# Patient Record
Sex: Male | Born: 1955 | Race: White | Hispanic: No | Marital: Single | Smoking: Current every day smoker
Health system: Southern US, Community
[De-identification: ages and names within clinical notes are randomized; demographics above are authoritative.]

## PROBLEM LIST (undated history)

## (undated) DIAGNOSIS — I10 Essential (primary) hypertension: Secondary | ICD-10-CM

## (undated) DIAGNOSIS — J439 Emphysema, unspecified: Secondary | ICD-10-CM

## (undated) DIAGNOSIS — I350 Nonrheumatic aortic (valve) stenosis: Secondary | ICD-10-CM

## (undated) DIAGNOSIS — F99 Mental disorder, not otherwise specified: Secondary | ICD-10-CM

## (undated) DIAGNOSIS — K4090 Unilateral inguinal hernia, without obstruction or gangrene, not specified as recurrent: Secondary | ICD-10-CM

## (undated) HISTORY — DX: Nonrheumatic aortic (valve) stenosis: I35.0

## (undated) HISTORY — DX: Mental disorder, not otherwise specified: F99

## (undated) HISTORY — DX: Unilateral inguinal hernia, without obstruction or gangrene, not specified as recurrent: K40.90

## (undated) HISTORY — PX: AORTIC VALVE REPLACEMENT: SHX41

---

## 2010-10-08 ENCOUNTER — Encounter: Payer: Self-pay | Admitting: *Deleted

## 2010-10-08 DIAGNOSIS — I359 Nonrheumatic aortic valve disorder, unspecified: Secondary | ICD-10-CM

## 2010-10-14 ENCOUNTER — Encounter: Payer: Medicare Other | Admitting: Cardiothoracic Surgery

## 2010-11-16 DIAGNOSIS — I359 Nonrheumatic aortic valve disorder, unspecified: Secondary | ICD-10-CM

## 2010-12-24 ENCOUNTER — Encounter: Payer: Self-pay | Admitting: Thoracic Surgery (Cardiothoracic Vascular Surgery)

## 2010-12-29 ENCOUNTER — Ambulatory Visit: Payer: Medicare Other

## 2011-01-05 ENCOUNTER — Ambulatory Visit (INDEPENDENT_AMBULATORY_CARE_PROVIDER_SITE_OTHER): Payer: Medicare Other | Admitting: Physician Assistant

## 2011-01-05 DIAGNOSIS — F209 Schizophrenia, unspecified: Secondary | ICD-10-CM

## 2011-01-05 DIAGNOSIS — J449 Chronic obstructive pulmonary disease, unspecified: Secondary | ICD-10-CM

## 2011-01-05 DIAGNOSIS — Z952 Presence of prosthetic heart valve: Secondary | ICD-10-CM

## 2011-01-05 DIAGNOSIS — Z720 Tobacco use: Secondary | ICD-10-CM

## 2011-01-05 DIAGNOSIS — I359 Nonrheumatic aortic valve disorder, unspecified: Secondary | ICD-10-CM

## 2011-01-05 NOTE — Progress Notes (Unsigned)
Billy Cole return to the office today for scheduled followup after aortic valve replacement on 12/03/2010 by Dr. Arvilla Market (25mm C-E MagnaEase tissue valve) for severe aortic stenosis and bicuspid aortic valve. His postoperative course was uncomplicated. Since his discharge home he is continuing to make satisfactory progress. He denies any shortness of breath. He continues to have some parasternal soreness.  Vital signs: temperature is afebrile, blood pressure 110/78, heart rate 100, respirations 18  Heart is in a regular rhythm. He has no murmur. Breath sounds are clear to auscultation and are full and equal. Sternal incision is intact and healing with no sign of complication. He has no peripheral edema. Medications are reviewed and include aspirin 81 mg by mouth daily Lopressor 25 mg by mouth twice a day perphenazine 8 mg by mouth bedtime 2 mg every morning. He is taking Tylenol for pain but requested a refill of his Vicodin. A new prescription is given for Vicodin 5/500 one by mouth every 6 hours when necessary pain. Dispense #60.  Impression: Satisfactory progress following aortic valve replacement for severe aortic stenosis and bicuspid aortic valve. No further followup is scheduled in our office. Billy Cole will followup with his cardiologist as instructed.

## 2011-02-27 DIAGNOSIS — Z952 Presence of prosthetic heart valve: Secondary | ICD-10-CM

## 2012-07-26 DIAGNOSIS — R079 Chest pain, unspecified: Secondary | ICD-10-CM

## 2013-03-08 DIAGNOSIS — F29 Unspecified psychosis not due to a substance or known physiological condition: Secondary | ICD-10-CM | POA: Diagnosis not present

## 2013-09-19 DIAGNOSIS — F29 Unspecified psychosis not due to a substance or known physiological condition: Secondary | ICD-10-CM | POA: Diagnosis not present

## 2014-03-06 DIAGNOSIS — F29 Unspecified psychosis not due to a substance or known physiological condition: Secondary | ICD-10-CM | POA: Diagnosis not present

## 2014-08-14 DIAGNOSIS — F29 Unspecified psychosis not due to a substance or known physiological condition: Secondary | ICD-10-CM | POA: Diagnosis not present

## 2014-09-30 DIAGNOSIS — Z6823 Body mass index (BMI) 23.0-23.9, adult: Secondary | ICD-10-CM | POA: Diagnosis not present

## 2014-09-30 DIAGNOSIS — J40 Bronchitis, not specified as acute or chronic: Secondary | ICD-10-CM | POA: Diagnosis not present

## 2014-09-30 DIAGNOSIS — Z1389 Encounter for screening for other disorder: Secondary | ICD-10-CM | POA: Diagnosis not present

## 2014-09-30 DIAGNOSIS — Z87891 Personal history of nicotine dependence: Secondary | ICD-10-CM | POA: Diagnosis not present

## 2014-09-30 DIAGNOSIS — Z0000 Encounter for general adult medical examination without abnormal findings: Secondary | ICD-10-CM | POA: Diagnosis not present

## 2014-09-30 DIAGNOSIS — F17200 Nicotine dependence, unspecified, uncomplicated: Secondary | ICD-10-CM | POA: Diagnosis not present

## 2014-10-02 DIAGNOSIS — F17200 Nicotine dependence, unspecified, uncomplicated: Secondary | ICD-10-CM | POA: Diagnosis not present

## 2014-10-02 DIAGNOSIS — Z6823 Body mass index (BMI) 23.0-23.9, adult: Secondary | ICD-10-CM | POA: Diagnosis not present

## 2014-10-02 DIAGNOSIS — Z952 Presence of prosthetic heart valve: Secondary | ICD-10-CM | POA: Diagnosis not present

## 2014-10-02 DIAGNOSIS — J209 Acute bronchitis, unspecified: Secondary | ICD-10-CM | POA: Diagnosis not present

## 2014-10-02 DIAGNOSIS — J40 Bronchitis, not specified as acute or chronic: Secondary | ICD-10-CM | POA: Diagnosis not present

## 2014-10-02 DIAGNOSIS — K219 Gastro-esophageal reflux disease without esophagitis: Secondary | ICD-10-CM | POA: Diagnosis not present

## 2014-10-02 DIAGNOSIS — Z0000 Encounter for general adult medical examination without abnormal findings: Secondary | ICD-10-CM | POA: Diagnosis not present

## 2014-10-10 DIAGNOSIS — Z1389 Encounter for screening for other disorder: Secondary | ICD-10-CM | POA: Diagnosis not present

## 2014-10-10 DIAGNOSIS — Z87891 Personal history of nicotine dependence: Secondary | ICD-10-CM | POA: Diagnosis not present

## 2014-11-18 ENCOUNTER — Encounter: Payer: Medicare Other | Admitting: Vascular Surgery

## 2014-11-25 ENCOUNTER — Encounter: Payer: Self-pay | Admitting: Vascular Surgery

## 2014-12-02 ENCOUNTER — Ambulatory Visit (INDEPENDENT_AMBULATORY_CARE_PROVIDER_SITE_OTHER): Payer: Medicare Other | Admitting: Vascular Surgery

## 2014-12-02 ENCOUNTER — Encounter: Payer: Self-pay | Admitting: Vascular Surgery

## 2014-12-02 VITALS — BP 114/75 | HR 89 | Ht 71.0 in | Wt 171.2 lb

## 2014-12-02 DIAGNOSIS — I728 Aneurysm of other specified arteries: Secondary | ICD-10-CM

## 2014-12-02 NOTE — Addendum Note (Signed)
Addended by: Adria DillELDRIDGE-LEWIS, Tonimarie Gritz L on: 12/02/2014 05:16 PM   Modules accepted: Orders

## 2014-12-02 NOTE — Progress Notes (Signed)
[facility] NOTE   MRN : [id number]  Reason for Consult: CT scan shows splenic artery aneursym Referring Physician: [facility] Dr.   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  He has no symptoms of abdominal pain or unexplained weight loss.  Past medical history includes: tobacco abuse and bronchitis.  He denise CAD, hypertension or hyperlipidemia.  He has had an aortic valve replacement 3-4 years ago in [facility].     Current Outpatient Prescriptions  Medication Sig Dispense Refill  . albuterol (PROVENTIL HFA;VENTOLIN HFA) 108 (90 BASE) MCG/ACT inhaler Inhale 2 puffs into the lungs every 6 (six) hours as needed for wheezing or shortness of breath.    . Hydrocodone-Acetaminophen (VICODIN PO) 1 or 2 every 4 hours as needed for pain     . Metoprolol Tartrate (LOPRESSOR PO) Take 25 mg by mouth 2 (two) times daily.      Marland Kitchen perphenazine (TRILAFON) 8 MG tablet Resume perphenazine  p.o. at bedtime and  q a.m.      No current facility-administered medications for this visit.    Pt meds include: Statin :No Betablocker: No ASA: No Other anticoagulants/antiplatelets: none  Past Medical History  Diagnosis Date  . Aortic stenosis   . Left inguinal hernia   . Psychiatric disorder     Past Surgical History  Procedure Laterality Date  . Aortic valve replacement      with perrcardial tissue valve [employer] model 3300TFX 25mm, serial number [id number] on  11-16-2010    Social History Social History  Substance Use Topics  . Smoking status: Current Every Day Smoker -- 1.00 packs/day    Types: Cigarettes  . Smokeless tobacco: None  . Alcohol Use: No    Family History   No Known Allergies   REVIEW OF SYSTEMS  General:  Weight loss,  Fever,   chills Neurologic:  Dizziness,  Blackouts,  Seizure  Stroke,  "Mini stroke",  Slurred speech,  Temporary blindness;  weakness in arms or legs,  Hoarseness  Dysphagia Cardiac:  Chest pain/pressure,  Shortness of breath at rest  Shortness of breath with exertion,  Atrial fibrillation or irregular heartbeat  Vascular:  Pain in legs with walking,  Pain in legs at rest,  Pain in legs at night,   Non-healing ulcer,  Blood clot in vein/DVT,   Pulmonary:  Home oxygen, [ x] Productive cough,  Coughing up blood,  Asthma,   Wheezing  COPD Musculoskeletal:   Arthritis,  Low back pain,  Joint pain Hematologic:  Easy Bruising,  Anemia;  Hepatitis Gastrointestinal:  Blood in stool,  Gastroesophageal Reflux/heartburn, Urinary:  chronic Kidney disease,  on HD -  MWF or  TTHS,  Burning with urination,  Difficulty urinating Skin:  Rashes,  Wounds Psychological:  Anxiety,  Depression  Physical Examination Filed Vitals:   12/02/14 1401  BP: 114/75  Pulse: 89  Height:  (1.803 m)  Weight: 171 lb 3.2 oz (77.656 kg)  SpO2: 97%   Body mass index is 23.89 kg/(m^2).  General:  WDWN in NAD Gait: Normal HENT: WNL Eyes: Pupils equal Pulmonary: normal non-labored breathing , without Rales, rhonchi,  wheezing Cardiac: RRR, without  Murmurs, rubs or gallops; No carotid bruits Abdomen: soft, NT, no masses Skin: no rashes, ulcers noted;  no Gangrene , no cellulitis; no open wounds;   Vascular Exam/Pulses:Palpable radial, brachial, femoral, DP/PT pulses bilaterally   Musculoskeletal: no muscle wasting or atrophy; no edema  Neurologic: A&O X 3; Appropriate Affect ;  SENSATION: normal; MOTOR FUNCTION: 5/5 Symmetric Speech is fluent/normal   Significant Diagnostic Studies: CBC No results found for: WBC, HGB, HCT, MCV, PLT  BMET No results found for: NA, K, CL, CO2, GLUCOSE,  BUN, CREATININE, CALCIUM, GFRNONAA, GFRAA CrCl cannot be calculated (Patient has no serum creatinine result on file.).  COAG No results found for: INR, PROTIME   Non-Invasive Vascular Imaging:  10/07/2014 CT chest w/o contrast revealed 2 cm calcified splenic artery The CT was reviewed with Dr. Arbie CookeyEarly   ASSESSMENT/PLAN:  2 cm calcified splenic artery He is asymptomatic and this was an incidental finding.  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  We also discussed symptoms of sever pain in the abdomin would be a sign of rupture.  We recommend repeat CT angio of the abdomin in 1 year.  Clinton GallantCOLLINS, EMMA [facility] 12/02/2014 2:40 PM  The patient was seen in conjunction with Dr. Arbie CookeyEarly I have examined the patient, reviewed and agree with above. Explained this 2 cm splenic artery aneurysm as an incidental finding. It is away from the splenic hilum. Explain the low risk of rupture short-term basis. Have recommended repeat CT scan one year to rule out enlargement. Did explain symptoms of leaking splenic aneurysm to him and he knows to report immediately to the emergency room should this occur  Gretta BeganEarly, Robb Sibal, MD 12/02/2014 3:13 PM

## 2014-12-04 NOTE — Addendum Note (Signed)
Addended by: Adria DillELDRIDGE-LEWIS, Digby Groeneveld L on: 12/04/2014 04:38 PM   Modules accepted: Orders

## 2014-12-08 NOTE — Addendum Note (Signed)
Addended by: Adria DillELDRIDGE-LEWIS, Antwonette Feliz L on: 12/08/2014 04:18 PM   Modules accepted: Orders

## 2015-01-12 ENCOUNTER — Other Ambulatory Visit: Payer: Self-pay

## 2015-01-12 DIAGNOSIS — I728 Aneurysm of other specified arteries: Secondary | ICD-10-CM

## 2015-01-16 DIAGNOSIS — F29 Unspecified psychosis not due to a substance or known physiological condition: Secondary | ICD-10-CM | POA: Diagnosis not present

## 2015-03-25 DIAGNOSIS — Z87891 Personal history of nicotine dependence: Secondary | ICD-10-CM | POA: Diagnosis not present

## 2015-03-25 DIAGNOSIS — Z952 Presence of prosthetic heart valve: Secondary | ICD-10-CM | POA: Diagnosis not present

## 2015-03-25 DIAGNOSIS — R918 Other nonspecific abnormal finding of lung field: Secondary | ICD-10-CM | POA: Diagnosis not present

## 2015-03-25 DIAGNOSIS — F17200 Nicotine dependence, unspecified, uncomplicated: Secondary | ICD-10-CM | POA: Diagnosis not present

## 2015-04-10 DIAGNOSIS — F29 Unspecified psychosis not due to a substance or known physiological condition: Secondary | ICD-10-CM | POA: Diagnosis not present

## 2015-10-07 DIAGNOSIS — F29 Unspecified psychosis not due to a substance or known physiological condition: Secondary | ICD-10-CM | POA: Diagnosis not present

## 2015-11-20 ENCOUNTER — Other Ambulatory Visit: Payer: Medicare Other

## 2015-12-01 ENCOUNTER — Other Ambulatory Visit: Payer: Medicare Other

## 2015-12-02 ENCOUNTER — Encounter: Payer: Self-pay | Admitting: Vascular Surgery

## 2015-12-08 ENCOUNTER — Other Ambulatory Visit: Payer: Self-pay | Admitting: Vascular Surgery

## 2015-12-08 ENCOUNTER — Other Ambulatory Visit: Payer: Self-pay

## 2015-12-08 ENCOUNTER — Ambulatory Visit: Payer: Medicare Other | Admitting: Vascular Surgery

## 2015-12-08 DIAGNOSIS — I728 Aneurysm of other specified arteries: Secondary | ICD-10-CM

## 2015-12-16 ENCOUNTER — Ambulatory Visit
Admission: RE | Admit: 2015-12-16 | Discharge: 2015-12-16 | Disposition: A | Discharge status: Home / Self Care | Payer: Medicare Other | Source: Ambulatory Visit | Attending: Vascular Surgery | Admitting: Vascular Surgery

## 2015-12-16 DIAGNOSIS — I700 Atherosclerosis of aorta: Secondary | ICD-10-CM | POA: Diagnosis not present

## 2015-12-16 DIAGNOSIS — I728 Aneurysm of other specified arteries: Secondary | ICD-10-CM | POA: Diagnosis not present

## 2015-12-16 MED ORDER — IOPAMIDOL (ISOVUE-370) INJECTION 76%: 75.0000 mL | Freq: Once | INTRAVENOUS | Status: DC | PRN

## 2015-12-25 ENCOUNTER — Encounter: Payer: Self-pay | Admitting: Vascular Surgery

## 2016-01-12 ENCOUNTER — Ambulatory Visit (INDEPENDENT_AMBULATORY_CARE_PROVIDER_SITE_OTHER): Payer: Medicare Other | Admitting: Vascular Surgery

## 2016-01-12 ENCOUNTER — Encounter: Payer: Self-pay | Admitting: Vascular Surgery

## 2016-01-12 VITALS — BP 112/77 | HR 94 | Temp 97.5°F | Resp 16 | Ht 71.0 in | Wt 174.0 lb

## 2016-01-12 DIAGNOSIS — I728 Aneurysm of other specified arteries: Secondary | ICD-10-CM

## 2016-01-12 NOTE — Addendum Note (Signed)
Addended by: Burton ApleyPETTY, Julie Nay A on: 01/12/2016 02:09 PM   Modules accepted: Orders

## 2016-01-12 NOTE — Progress Notes (Signed)
Vascular and Vein Specialist of [facility]  Patient name: Billy Cole MRN: [id number] DOB: 1955-06-10 Sex: male  REASON FOR VISIT: Follow-up of splenic artery aneurysm  HPI: Billy Cole is a 61 y.o. male here today for follow-up.  He had an incidental finding of a 3 cm splenic artery aneurysm during the time of a CT scan for screening for lung cancer.  This was a noncontrast study.  He this was a 1 year ago and he is here today for follow-up.  He reports no new medical difficulty since my last visit with him.  Continues smoking pack cigarettes per day.  Does not have any abdominal complaints  Past Medical History:  Diagnosis Date  . Aortic stenosis   . Left inguinal hernia   . Psychiatric disorder     No family history on file.  SOCIAL HISTORY: Social History  Substance Use Topics  . Smoking status: Current Every Day Smoker    Packs/day: 1.00    Types: Cigarettes  . Smokeless tobacco: Never Used  . Alcohol use No    No Known Allergies  Current Outpatient Prescriptions  Medication Sig Dispense Refill  . albuterol (PROVENTIL HFA;VENTOLIN HFA) 108 (90 BASE) MCG/ACT inhaler Inhale 2 puffs into the lungs every 6 (six) hours as needed for wheezing or shortness of breath.    . Aspirin Effervescent (ALKA-SELTZER EXTRA STRENGTH) 500 MG TBEF Take by mouth.    . Metoprolol Tartrate (LOPRESSOR PO) Take 25 mg by mouth 2 (two) times daily.      Marland Kitchen perphenazine (TRILAFON) 8 MG tablet Resume perphenazine 8mg  p.o. at bedtime and 2mg  q a.m.      No current facility-administered medications for this visit.     REVIEW OF SYSTEMS:  [X]  denotes positive finding, [ ]  denotes negative finding Cardiac  Comments:  Chest pain or chest pressure:    Shortness of breath upon exertion: x   Short of breath when lying flat:    Irregular heart rhythm:        Vascular    Pain in calf, thigh, or hip brought on by ambulation:    Pain in feet at night that wakes you up  from your sleep:     Blood clot in your veins:    Leg swelling:           PHYSICAL EXAM: Vitals:   01/12/16 1326  BP: 112/77  Pulse: 94  Resp: 16  Temp: 97.5 F (36.4 C)  TempSrc: Oral  SpO2: 96%  Weight: 174 lb (78.9 kg)  Height: 5\' 11"  (1.803 m)    GENERAL: The patient is a well-nourished male, in no acute distress. The vital signs are documented above. CARDIOVASCULAR: 2+ radial pulses bilaterally Abdomen soft and nontender.  No bruits and no masses noted PULMONARY: There is good air exchange  MUSCULOSKELETAL: There are no major deformities or cyanosis. NEUROLOGIC: No focal weakness or paresthesias are detected. SKIN: There are no ulcers or rashes noted. PSYCHIATRIC: The patient has a normal affect.  DATA:  CT angiogram was reviewed.  This shows a 2.2 cm splenic artery aneurysm in the midportion of the splenic artery.  It is calcified.  There is a great deal of mural thrombus present.  MEDICAL ISSUES: Discussed these findings with the patient.  Recommended follow-up in 1 year with CT scan to rule out enlargement of CT incidental finding of a splenic artery aneurysm.  These had no change in one year with drop back to every other year imaging.  Again discussed symptoms of a ruptured splenic artery and he knows to report immediately should this occur to the emergency room    Larina Earthlyodd F. Michelyn Scullin, MD [facility] Office Tel [phone] Pager [phone]

## 2016-02-17 DIAGNOSIS — F29 Unspecified psychosis not due to a substance or known physiological condition: Secondary | ICD-10-CM | POA: Diagnosis not present

## 2016-04-20 DIAGNOSIS — M79601 Pain in right arm: Secondary | ICD-10-CM | POA: Diagnosis not present

## 2016-04-20 DIAGNOSIS — M7711 Lateral epicondylitis, right elbow: Secondary | ICD-10-CM | POA: Diagnosis not present

## 2016-04-23 DIAGNOSIS — N201 Calculus of ureter: Secondary | ICD-10-CM | POA: Diagnosis not present

## 2016-04-23 DIAGNOSIS — R1084 Generalized abdominal pain: Secondary | ICD-10-CM | POA: Diagnosis not present

## 2016-04-23 DIAGNOSIS — K769 Liver disease, unspecified: Secondary | ICD-10-CM | POA: Diagnosis not present

## 2016-05-09 DIAGNOSIS — R3129 Other microscopic hematuria: Secondary | ICD-10-CM | POA: Diagnosis not present

## 2016-05-09 DIAGNOSIS — R1032 Left lower quadrant pain: Secondary | ICD-10-CM | POA: Diagnosis not present

## 2016-05-09 DIAGNOSIS — N200 Calculus of kidney: Secondary | ICD-10-CM | POA: Diagnosis not present

## 2016-06-09 DIAGNOSIS — N401 Enlarged prostate with lower urinary tract symptoms: Secondary | ICD-10-CM | POA: Diagnosis not present

## 2016-06-09 DIAGNOSIS — R3129 Other microscopic hematuria: Secondary | ICD-10-CM | POA: Diagnosis not present

## 2016-06-09 DIAGNOSIS — Z125 Encounter for screening for malignant neoplasm of prostate: Secondary | ICD-10-CM | POA: Diagnosis not present

## 2016-08-19 DIAGNOSIS — Z23 Encounter for immunization: Secondary | ICD-10-CM | POA: Diagnosis not present

## 2016-08-19 DIAGNOSIS — S61220A Laceration with foreign body of right index finger without damage to nail, initial encounter: Secondary | ICD-10-CM | POA: Diagnosis not present

## 2016-08-19 DIAGNOSIS — S61209A Unspecified open wound of unspecified finger without damage to nail, initial encounter: Secondary | ICD-10-CM | POA: Diagnosis not present

## 2016-08-19 DIAGNOSIS — F29 Unspecified psychosis not due to a substance or known physiological condition: Secondary | ICD-10-CM | POA: Diagnosis not present

## 2016-08-24 ENCOUNTER — Other Ambulatory Visit: Payer: Self-pay

## 2016-08-24 DIAGNOSIS — I728 Aneurysm of other specified arteries: Secondary | ICD-10-CM

## 2016-12-07 DIAGNOSIS — F29 Unspecified psychosis not due to a substance or known physiological condition: Secondary | ICD-10-CM | POA: Diagnosis not present

## 2017-01-17 ENCOUNTER — Inpatient Hospital Stay: Admission: RE | Admit: 2017-01-17 | Payer: Medicare Other | Source: Ambulatory Visit

## 2017-01-17 ENCOUNTER — Ambulatory Visit: Payer: Medicare Other | Admitting: Vascular Surgery

## 2018-06-04 IMAGING — CT CT ANGIO ABDOMEN
2 of 6 series · 12 of 46 positions shown, 18 images · IV contrast (isovue)
Comparison: CT scan of March 25, 2015.

CLINICAL DATA: Splenic artery aneurysm.

EXAM:
CT ANGIOGRAPHY ABDOMEN
TECHNIQUE: Multidetector CT imaging of the abdomen was performed using the
standard protocol during bolus administration of intravenous
contrast. Multiplanar reconstructed images and MIPs were obtained
and reviewed to evaluate the vascular anatomy.
CONTRAST:  75 mL of Isovue 370 intravenously.

[Series 4: angio (id) · axial · 0.72mm/px · z∈[-149,+51]mm · 9 of 101 slices shown, 15 images]
[im 11/101  soft-tissue]
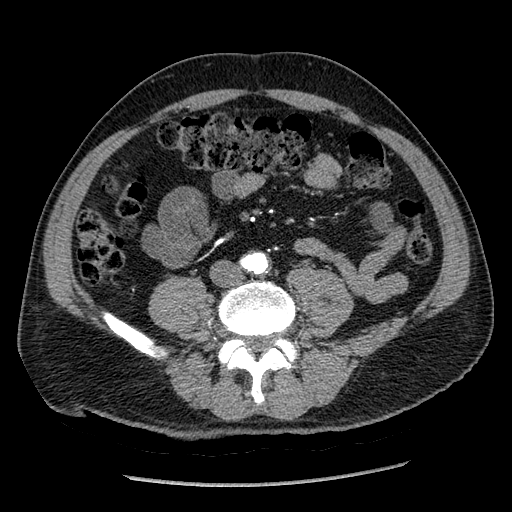
[im 11/101  bone]
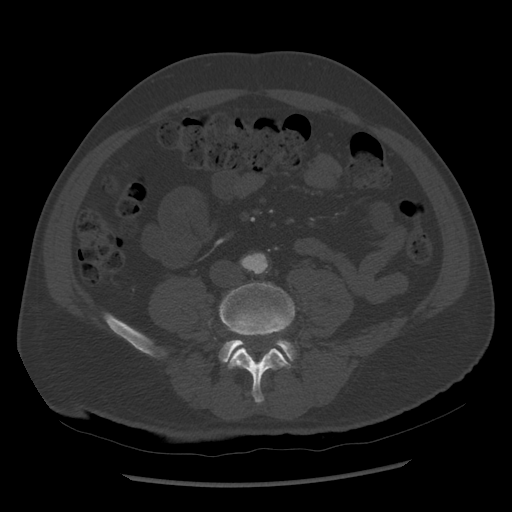
[im 21/101  soft-tissue]
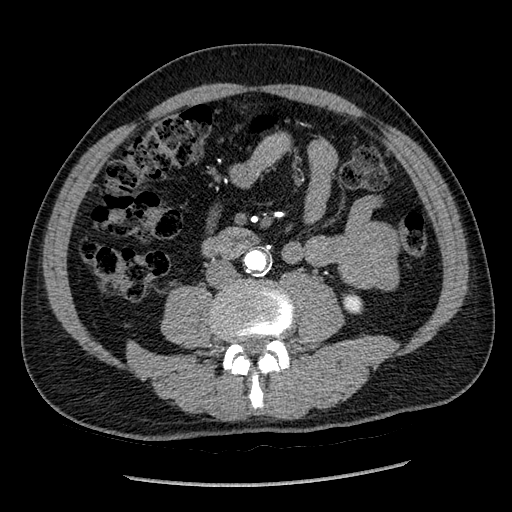
[im 31/101  soft-tissue]
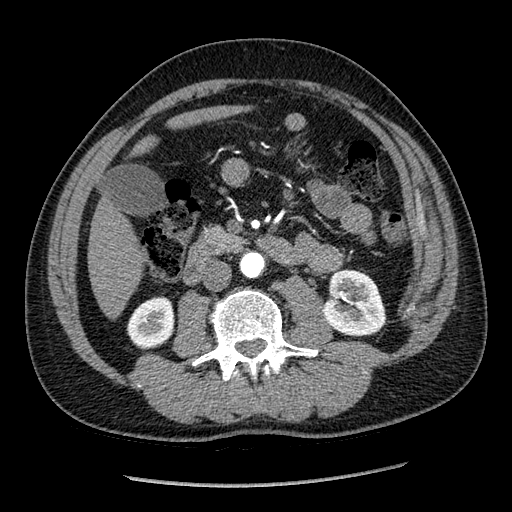
[im 41/101  soft-tissue]
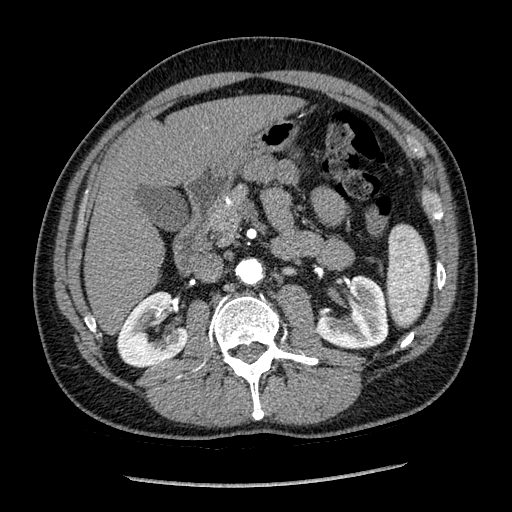
[im 51/101  soft-tissue]
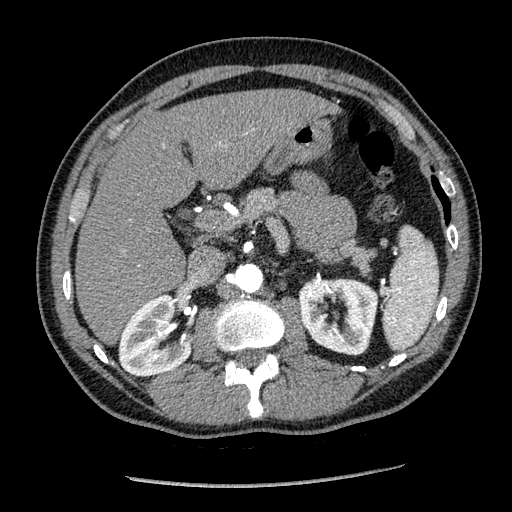
[im 61/101  soft-tissue]
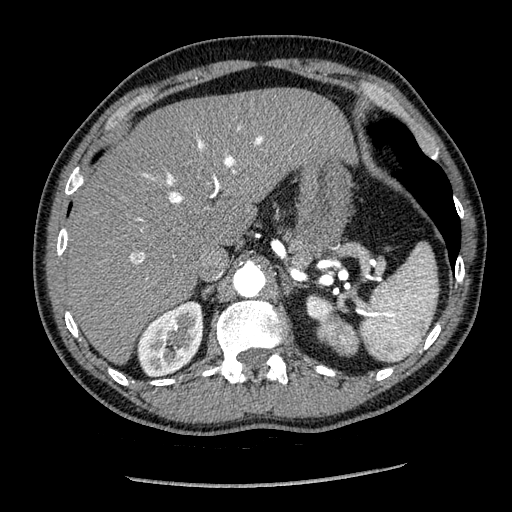
[im 61/101  lung]
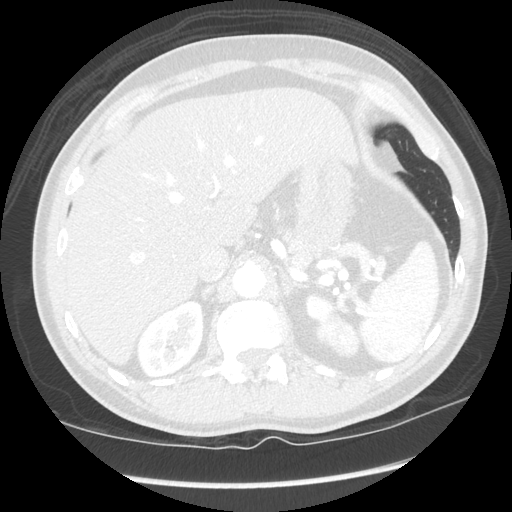
[im 71/101  soft-tissue]
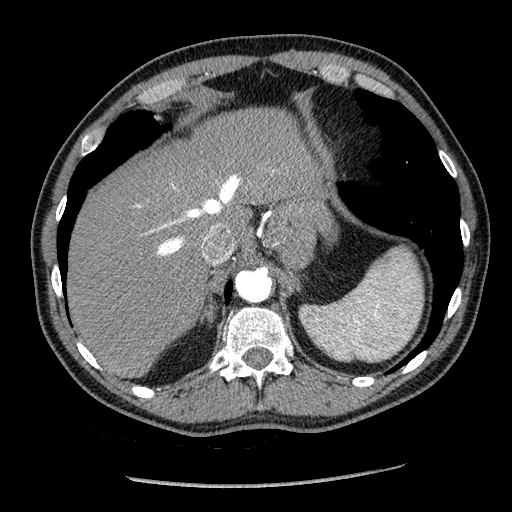
[im 71/101  lung]
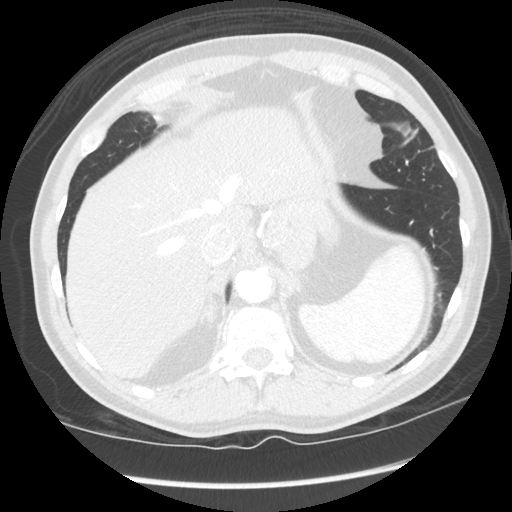
[im 81/101  soft-tissue]
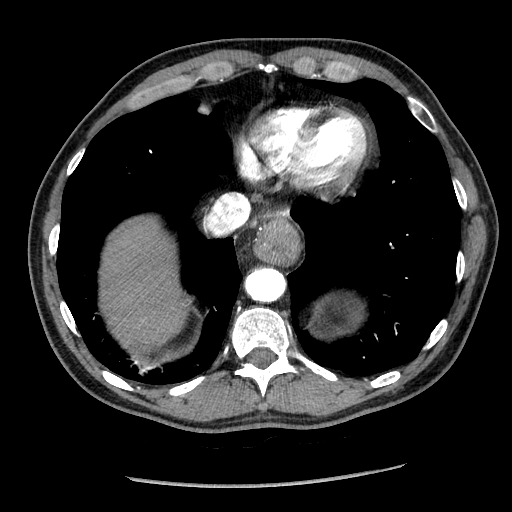
[im 81/101  lung]
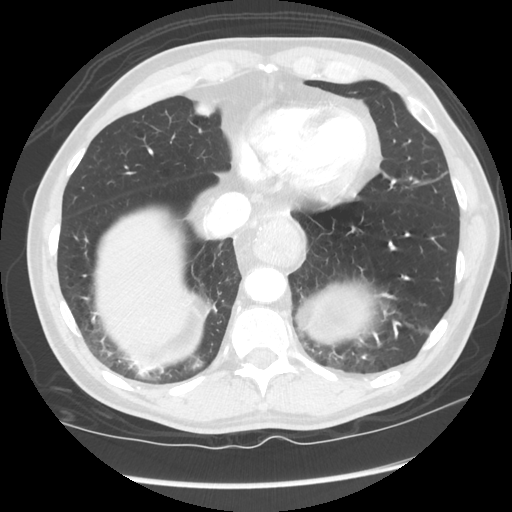
[im 91/101  soft-tissue]
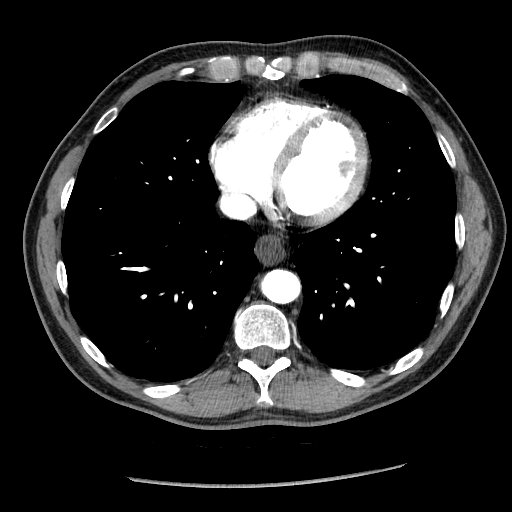
[im 91/101  lung]
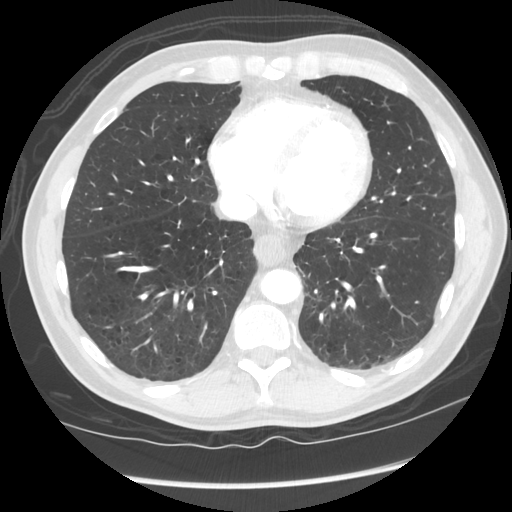
[im 91/101  bone]
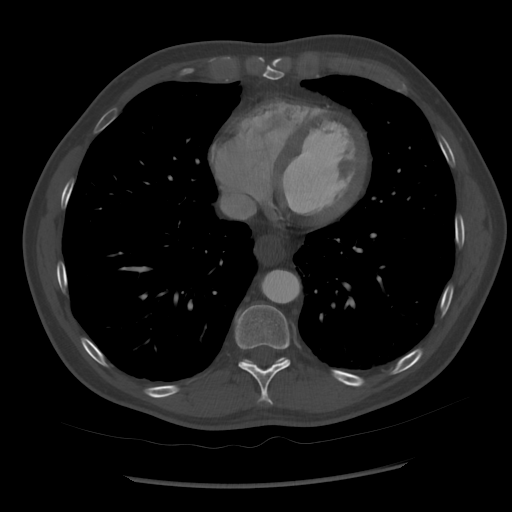

[Series 602: sagittal body · sagittal · 0.72mm/px · 3 of 143 slices shown]
[im 9/143  soft-tissue]
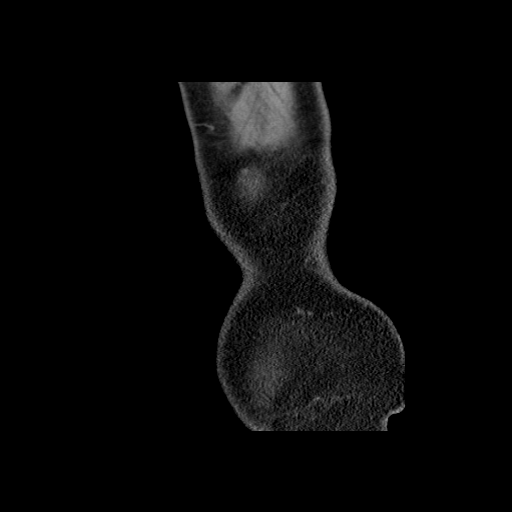
[im 27/143  soft-tissue]
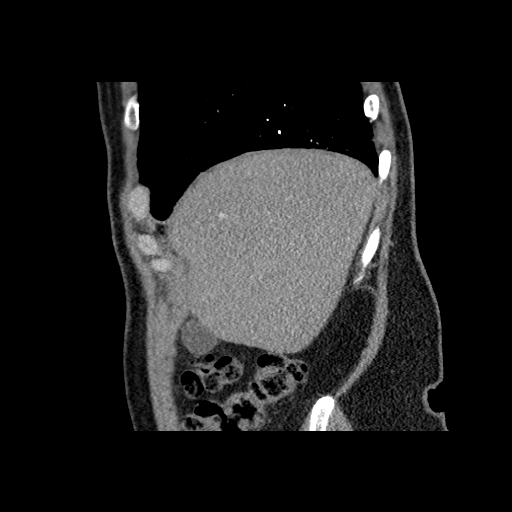
[im 45/143  soft-tissue]
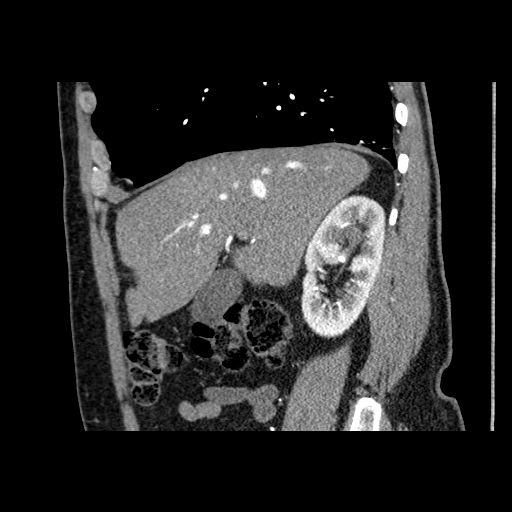

[12 of 46 positions shown; findings below may reference images not displayed]

FINDINGS: VASCULAR

Aorta: Atherosclerosis of abdominal aorta is noted without aneurysm
or dissection.

Celiac: No significant stenosis is noted. Hepatic and left gastric
arteries appear normal. 2.2 cm saccular aneurysm is seen arising
from the distal portion of the splenic artery with peripheral
calcifications. The aneurysm remains patent with a large amount of
mural thrombus.

SMA: Patent without evidence of aneurysm, dissection, vasculitis or
significant stenosis.

Renals: Both renal arteries are patent without evidence of aneurysm,
dissection, vasculitis, fibromuscular dysplasia or significant
stenosis.

IMA: Patent without evidence of aneurysm, dissection, vasculitis or
significant stenosis.

Inflow: Visualize common iliac arteries are patent with atheromatous
disease.

Review of the MIP images confirms the above findings.

NON-VASCULAR

Lower chest: Mild sliding-type hiatal hernia is noted. Lung bases
are clear.

Hepatobiliary: No focal liver abnormality is seen. Minimal
cholelithiasis is noted.

Pancreas: Unremarkable. No pancreatic ductal dilatation or
surrounding inflammatory changes.

Spleen: Normal in size without focal abnormality.

Adrenals/Urinary Tract: Adrenal glands and kidneys appear
unremarkable. No hydronephrosis or renal obstruction is noted.

Stomach/Bowel: No evidence of bowel obstruction.

Lymphatic: No definite adenopathy is noted.

Other: No abdominal wall hernia or abnormality. No ascites.

Musculoskeletal: No acute or significant osseous findings.
IMPRESSION: VASCULAR

2.2 cm saccular aneurysm arising from distal portion of splenic
artery.

Atherosclerosis of abdominal aorta without aneurysm or dissection.

NON-VASCULAR

Mild sliding-type hiatal hernia.

Minimal cholelithiasis without evidence of cholecystitis.

## 2020-01-22 DIAGNOSIS — F17210 Nicotine dependence, cigarettes, uncomplicated: Secondary | ICD-10-CM | POA: Diagnosis present

## 2020-02-27 DIAGNOSIS — F209 Schizophrenia, unspecified: Secondary | ICD-10-CM | POA: Diagnosis present

## 2021-11-23 ENCOUNTER — Other Ambulatory Visit: Payer: Self-pay

## 2021-11-23 ENCOUNTER — Emergency Department (HOSPITAL_COMMUNITY): Payer: Medicare Other

## 2021-11-23 ENCOUNTER — Encounter (HOSPITAL_COMMUNITY): Payer: Self-pay | Admitting: *Deleted

## 2021-11-23 ENCOUNTER — Observation Stay (HOSPITAL_COMMUNITY)
Admission: EM | Admit: 2021-11-23 | Discharge: 2021-11-24 | Disposition: A | Discharge status: Home / Self Care | Payer: Medicare Other | Attending: Obstetrics and Gynecology | Admitting: Obstetrics and Gynecology

## 2021-11-23 DIAGNOSIS — Z720 Tobacco use: Secondary | ICD-10-CM | POA: Diagnosis not present

## 2021-11-23 DIAGNOSIS — Z79899 Other long term (current) drug therapy: Secondary | ICD-10-CM | POA: Diagnosis not present

## 2021-11-23 DIAGNOSIS — R0902 Hypoxemia: Secondary | ICD-10-CM

## 2021-11-23 DIAGNOSIS — R0602 Shortness of breath: Secondary | ICD-10-CM | POA: Diagnosis not present

## 2021-11-23 DIAGNOSIS — Z952 Presence of prosthetic heart valve: Secondary | ICD-10-CM | POA: Diagnosis not present

## 2021-11-23 DIAGNOSIS — I10 Essential (primary) hypertension: Secondary | ICD-10-CM | POA: Insufficient documentation

## 2021-11-23 DIAGNOSIS — J9601 Acute respiratory failure with hypoxia: Principal | ICD-10-CM | POA: Diagnosis present

## 2021-11-23 DIAGNOSIS — I714 Abdominal aortic aneurysm, without rupture, unspecified: Secondary | ICD-10-CM | POA: Insufficient documentation

## 2021-11-23 DIAGNOSIS — J449 Chronic obstructive pulmonary disease, unspecified: Secondary | ICD-10-CM | POA: Diagnosis not present

## 2021-11-23 DIAGNOSIS — F17210 Nicotine dependence, cigarettes, uncomplicated: Secondary | ICD-10-CM | POA: Diagnosis not present

## 2021-11-23 DIAGNOSIS — J45909 Unspecified asthma, uncomplicated: Secondary | ICD-10-CM | POA: Diagnosis not present

## 2021-11-23 DIAGNOSIS — Z7982 Long term (current) use of aspirin: Secondary | ICD-10-CM | POA: Insufficient documentation

## 2021-11-23 DIAGNOSIS — R569 Unspecified convulsions: Secondary | ICD-10-CM

## 2021-11-23 DIAGNOSIS — Z1152 Encounter for screening for COVID-19: Secondary | ICD-10-CM | POA: Insufficient documentation

## 2021-11-23 DIAGNOSIS — F209 Schizophrenia, unspecified: Secondary | ICD-10-CM | POA: Diagnosis present

## 2021-11-23 HISTORY — DX: Emphysema, unspecified: J43.9

## 2021-11-23 HISTORY — DX: Essential (primary) hypertension: I10

## 2021-11-23 LAB — COMPREHENSIVE METABOLIC PANEL
ALT: 54 U/L — ABNORMAL HIGH (ref 0–44)
AST: 41 U/L (ref 15–41)
Albumin: 3.9 g/dL (ref 3.5–5.0)
Alkaline Phosphatase: 70 U/L (ref 38–126)
Anion gap: 12 (ref 5–15)
BUN: 12 mg/dL (ref 8–23)
CO2: 24 mmol/L (ref 22–32)
Calcium: 9.4 mg/dL (ref 8.9–10.3)
Chloride: 102 mmol/L (ref 98–111)
Creatinine, Ser: 1.16 mg/dL (ref 0.61–1.24)
GFR, Estimated: >60 mL/min (ref >60)
Glucose, Bld: 125 mg/dL — ABNORMAL HIGH (ref 70–99)
Potassium: 4.2 mmol/L (ref 3.5–5.1)
Sodium: 138 mmol/L (ref 135–145)
Total Bilirubin: 0.4 mg/dL (ref 0.3–1.2)
Total Protein: 7.2 g/dL (ref 6.5–8.1)

## 2021-11-23 LAB — CBC WITH DIFFERENTIAL/PLATELET
Abs Immature Granulocytes: 0.05 10*3/uL (ref 0.00–0.07)
Basophils Absolute: 0 10*3/uL (ref 0.0–0.1)
Basophils Relative: 1 %
Eosinophils Absolute: 0 10*3/uL (ref 0.0–0.5)
Eosinophils Relative: 0 %
HCT: 47.8 % (ref 39.0–52.0)
Hemoglobin: 16.9 g/dL (ref 13.0–17.0)
Immature Granulocytes: 1 %
Lymphocytes Relative: 10 %
Lymphs Abs: 0.9 10*3/uL (ref 0.7–4.0)
MCH: 31.5 pg (ref 26.0–34.0)
MCHC: 35.4 g/dL (ref 30.0–36.0)
MCV: 89 fL (ref 80.0–100.0)
Monocytes Absolute: 0.5 10*3/uL (ref 0.1–1.0)
Monocytes Relative: 6 %
Neutro Abs: 7.3 10*3/uL (ref 1.7–7.7)
Neutrophils Relative %: 82 %
Platelets: 175 10*3/uL (ref 150–400)
RBC: 5.37 MIL/uL (ref 4.22–5.81)
RDW: 13 % (ref 11.5–15.5)
WBC: 8.8 10*3/uL (ref 4.0–10.5)
nRBC: 0 % (ref 0.0–0.2)

## 2021-11-23 LAB — CBG MONITORING, ED: Glucose-Capillary: 128 mg/dL — ABNORMAL HIGH (ref 70–99)

## 2021-11-23 LAB — ETHANOL: Alcohol, Ethyl (B): <10 mg/dL (ref <10)

## 2021-11-23 MED ORDER — IPRATROPIUM-ALBUTEROL 0.5-2.5 (3) MG/3ML IN SOLN
RESPIRATORY_TRACT | Status: AC
  Filled 2021-11-23: qty 3

## 2021-11-23 MED ORDER — ENOXAPARIN SODIUM 40 MG/0.4ML IJ SOSY
40.0000 mg | PREFILLED_SYRINGE | Freq: Every day | INTRAMUSCULAR | Status: DC
  Administered 2021-11-24: 40 mg via SUBCUTANEOUS
  Filled 2021-11-23: qty 0.4

## 2021-11-23 MED ORDER — METHYLPREDNISOLONE SODIUM SUCC 40 MG IJ SOLR
40.0000 mg | Freq: Every day | INTRAMUSCULAR | Status: DC
  Administered 2021-11-24 (×2): 40 mg via INTRAVENOUS
  Filled 2021-11-23 (×2): qty 1

## 2021-11-23 MED ORDER — IPRATROPIUM-ALBUTEROL 0.5-2.5 (3) MG/3ML IN SOLN: 3.0000 mL | Freq: Four times a day (QID) | RESPIRATORY_TRACT | Status: DC | PRN

## 2021-11-23 MED ORDER — IPRATROPIUM-ALBUTEROL 0.5-2.5 (3) MG/3ML IN SOLN
3.0000 mL | Freq: Once | RESPIRATORY_TRACT | Status: AC
  Administered 2021-11-23: 3 mL via RESPIRATORY_TRACT

## 2021-11-23 NOTE — ED Triage Notes (Signed)
Pt from home with [facility] for seizure. Per family, they were watching TV when patient had a seizure lasting ~49mins. On EMS arrival, pt posticial and had been incontinent of urine. Room air sats 89%, placed on 3L oxygen with improvement. 18g IV to R AC.

## 2021-11-23 NOTE — H&P (Signed)
History and Physical    Patient: Billy Cole MVH:[id number] DOB: [birth year] DOA: 11/23/2021 DOS: the patient was seen and examined on 11/23/2021 PCP: System, Provider Not In  Patient coming from: Home  Chief Complaint:  Chief Complaint  Patient presents with   Seizures   HPI: Billy Cole is a 66 y.o. male with medical history significant of COPD, ongoing tobacco use, HLD, AAA, splenic artery aneursym, schizophrenia, HLD who presents with seizure like activity.   Patient is a limited historian.  He only remembers waking up to EMS surrounding him and not able to recall any events about today.  Unable to locate family for collateral.  Per documentation, girlfriend says he was watching TV and had a full body seizure with incontinence.   He also report progressive increasing shortness of breath for at least the past year when climbing stairs up to his trailer.  Has nonproductive cough.  He smokes about a pack a day for "many years."  Denies any alcohol or illicit drug use.  In the ED, temperature of 97.63F, HR of 103, was hypoxic down to 89% on 2.4 L.  CBC with no leukocytosis or anemia.  Sodium of 138, K of 4.2, creatinine of 1.16, CBG of 125.  EtOH is less than 10.   Review of Systems: As mentioned in the history of present illness. All other systems reviewed and are negative. Past Medical History:  Diagnosis Date   Aortic stenosis    Emphysema lung (HCC)    Hypertension    Left inguinal hernia    Psychiatric disorder    Past Surgical History:  Procedure Laterality Date   AORTIC VALVE REPLACEMENT     with perrcardial tissue valve [employer] model 3300TFX 43mm, serial number [id number] on  11-16-2010   Social History:  reports that he has been smoking cigarettes. He has been smoking an average of 1 pack per day. He has never used smokeless tobacco. He reports that he does not drink alcohol and does not use drugs.  No Known Allergies  No family history on file.  Prior  to Admission medications   Medication Sig Start Date End Date Taking? Authorizing Provider  albuterol (PROVENTIL HFA;VENTOLIN HFA) 108 (90 BASE) MCG/ACT inhaler Inhale 2 puffs into the lungs every 6 (six) hours as needed for wheezing or shortness of breath.    [provider]  Aspirin Effervescent (ALKA-SELTZER EXTRA STRENGTH) 500 MG TBEF Take by mouth.    [provider]  Metoprolol Tartrate (LOPRESSOR PO) Take 25 mg by mouth 2 (two) times daily.      [provider]  perphenazine (TRILAFON) 8 MG tablet Resume perphenazine 8mg  p.o. at bedtime and 2mg  q a.m.     [provider]    Physical Exam: Vitals:   11/23/21 1953 11/23/21 2000 11/23/21 2007 11/23/21 2015  BP:  113/71  113/76  Pulse:  (!) 103  96  Resp:  16  (!) 27  Temp:   97.6 F (36.4 C)   TempSrc:   Oral   SpO2: (!) 89% 93%  94%   Constitutional: NAD, calm, comfortable, elderly male appearing much older than stated age sitting upright in bed Eyes: lids and conjunctivae normal ENMT: Mucous membranes are moist. Neck: normal, supple Respiratory: Diminished lung sounds throughout but no wheezing, no crackles. Normal respiratory effort. No accessory muscle use.  Cardiovascular: Regular rate and rhythm, no murmurs / rubs / gallops. No extremity edema.  Abdomen: no tenderness, no masses palpated.  Bowel sounds  positive.  Musculoskeletal: no clubbing / cyanosis. No joint deformity upper and lower extremities. Good ROM, no contractures. Normal muscle tone.  Skin: no rashes, lesions, ulcers.  Neurologic: CN 2-12 grossly intact.  Strength 5/5 in all 4.  Psychiatric: Normal judgment and insight. Alert and oriented x 3. Normal mood. Data Reviewed:  See HPI  Assessment and Plan: * Acute respiratory failure with hypoxia ([place]) Presented with symptoms of progressive shortness of breath, cough and hypoxia with oxygen of 89% requiring 2 L. -He does not have a formal diagnosis of COPD but at least a  30-pack-year smoking history -Will place on daily IV solu medrol -PRN duoneb q6hr  -goal O2 of >92%  AAA (abdominal aortic aneurysm) (HCC) Last AAA stable 4.7 cm on CT 09/2021  Tobacco use Reports 1 pack per day smoking hx for at least 30 years  Seizure ([place]) - Questionable seizure-like activity earlier today.  Unable to reach family for collateral.  Reportedly girlfriend saw him having a full tonic-clonic seizure.  No prior history of seizures or head trauma.  Denies any illicit drug use but will check UDS. -CT head is negative.  Will evaluate further with MRI brain without contrast and EEG -Placed on seizure precaution -Discussed [place] state law regarding no driving until seizure-free for 6 months.  See details below.    SEIZURE PRECAUTIONS Per [facility] statutes, patients with seizures are not allowed to drive until they have been seizure-free for six months.    Use caution when using heavy equipment or power tools. Avoid working on ladders or at heights. Take showers instead of baths. Ensure the water temperature is not too high on the home water heater. Do not go swimming alone. Do not lock yourself in a room alone (i.e. bathroom). When caring for infants or small children, sit down when holding, feeding, or changing them to minimize risk of injury to the child in the event you have a seizure. Maintain good sleep hygiene. Avoid alcohol.    If patient has another seizure, call 911 and bring them back to the ED if: A.  The seizure lasts longer than 5 minutes.      B.  The patient doesn't wake shortly after the seizure or has new problems such as difficulty seeing, speaking or moving following the seizure C.  The patient was injured during the seizure D.  The patient has a temperature over 102 F (39C) E.  The patient vomited during the seizure and now is having trouble breathing   Advance Care Planning:   Code Status: Full Code full  Consults: None  Family  Communication: Unable to locate family by phone  Severity of Illness: The appropriate patient status for this patient is OBSERVATION. Observation status is judged to be reasonable and necessary in order to provide the required intensity of service to ensure the patient's safety. The patient's presenting symptoms, physical exam findings, and initial radiographic and laboratory data in the context of their medical condition is felt to place them at decreased risk for further clinical deterioration. Furthermore, it is anticipated that the patient will be medically stable for discharge from the hospital within 2 midnights of admission.   Author: Orene Desanctis, DO 11/23/2021 11:58 PM  For on call review [email or web address].

## 2021-11-23 NOTE — Assessment & Plan Note (Signed)
-   Questionable seizure-like activity earlier today.  Unable to reach family for collateral.  Reportedly girlfriend saw him having a full tonic-clonic seizure.  No prior history of seizures or head trauma.  Denies any illicit drug use but will check UDS. -CT head is negative.  Will evaluate further with MRI brain without contrast and EEG -Placed on seizure precaution -Discussed [place] state law regarding no driving until seizure-free for 6 months.  See details below.

## 2021-11-23 NOTE — Assessment & Plan Note (Signed)
Last AAA stable 4.7 cm on CT 09/2021

## 2021-11-23 NOTE — ED Provider Notes (Signed)
[facility] EMERGENCY DEPARTMENT Provider Note   CSN: [id number] Arrival date & time: 11/23/21  1945     History  Chief Complaint  Patient presents with   Seizures    Billy Cole is a 66 y.o. male with a self-reported past medical history of asthma presenting today after a questionable seizure.  He reports he was sitting and watching TV and when he woke up there were paramedics all around him.  He denies any recent illnesses but does say for the past year he has had a cough that is occasionally productive.  Denies EtOH or drug use.      Seizures      Home Medications Prior to Admission medications   Medication Sig Start Date End Date Taking? Authorizing Provider  albuterol (PROVENTIL HFA;VENTOLIN HFA) 108 (90 BASE) MCG/ACT inhaler Inhale 2 puffs into the lungs every 6 (six) hours as needed for wheezing or shortness of breath.    [provider]  Aspirin Effervescent (ALKA-SELTZER EXTRA STRENGTH) 500 MG TBEF Take by mouth.    [provider]  Metoprolol Tartrate (LOPRESSOR PO) Take 25 mg by mouth 2 (two) times daily.      [provider]  perphenazine (TRILAFON) 8 MG tablet Resume perphenazine 8mg  p.o. at bedtime and 2mg  q a.m.     [provider]      Allergies    Patient has no known allergies.    Review of Systems   Review of Systems  Neurological:  Positive for seizures.    Physical Exam Updated Vital Signs BP 113/71   Pulse (!) 103   Resp 16   SpO2 93%  Physical Exam Vitals and nursing note reviewed.  Constitutional:      General: He is not in acute distress.    Appearance: Normal appearance. He is not ill-appearing.  HENT:     Head: Normocephalic and atraumatic.     Mouth/Throat:     Mouth: Mucous membranes are moist.     Pharynx: Oropharynx is clear.     Comments: No trauma in the oropharynx Eyes:     General: No scleral icterus.    Conjunctiva/sclera: Conjunctivae normal.  Cardiovascular:      Rate and Rhythm: Normal rate and regular rhythm.  Pulmonary:     Effort: Pulmonary effort is normal. No respiratory distress.     Breath sounds: Wheezing (Scattered) present. Rales: Scattered. Genitourinary:    Comments: Urine in patient's groin area Musculoskeletal:        General: No tenderness or signs of injury. Normal range of motion.     Cervical back: Normal range of motion.     Right lower leg: No edema.     Left lower leg: No edema.  Skin:    General: Skin is warm and dry.     Findings: No rash.  Neurological:     General: No focal deficit present.     Mental Status: He is alert and oriented to person, place, and time.     Cranial Nerves: No cranial nerve deficit.     Motor: No weakness.     Coordination: Coordination normal.  Psychiatric:        Mood and Affect: Mood normal.     ED Results / Procedures / Treatments   Labs (all labs ordered are listed, but only abnormal results are displayed) Labs Reviewed  COMPREHENSIVE METABOLIC PANEL - Abnormal; Notable for the following components:      Result Value  Glucose, Bld 125 (*)    ALT 54 (*)    All other components within normal limits  CBG MONITORING, ED - Abnormal; Notable for the following components:   Glucose-Capillary 128 (*)    All other components within normal limits  RESP PANEL BY RT-PCR (FLU A&B, COVID) [id number]  CBC WITH DIFFERENTIAL/PLATELET  ETHANOL  CBG MONITORING, ED    EKG None  Radiology No results found.  Procedures Procedures   Medications Ordered in ED Medications - No data to display  ED Course/ Medical Decision Making/ A&P Clinical Course as of 11/23/21 2250  Tue Nov 23, 2021  2235 Oxygen saturations dropped to 86% on room air [MR]  2248 Patient sounds better after initiation of DuoNeb [MR]    Clinical Course User Index [MR] Notnamed Croucher, Gabriel Cirri, PA-C                           Medical Decision Making Amount and/or Complexity of Data Reviewed Labs: ordered. Radiology:  ordered.  Risk Prescription drug management. Decision regarding hospitalization.   66 year old male presenting today with concern of a seizure.  No history of this.  Differential includes but is not limited to seizure, syncope, brain mass, arrhythmia, ingestion.  this is not an exhaustive differential.    Past Medical History / Co-morbidities / Social History: Asthma?  COPD?  Tobacco use   Additional history:  I spoke with the patient's girlfriend who reports that he was sitting in his recliner and then started having full body convulsions.  She says they lasted between 4 to 5 minutes.  He did not fall or strike his head.  She said he was also drooling.  When he eventually stopped he seemed out of it and was only oriented to who he was and who she was.  She states that the patient does not drink alcohol or do any drugs.  She does say that he "smokes like a freight train."  Also is concerned that he may have pneumonia because he has been coughing up more phlegm than usual.  Additionally she says that he goes through an albuterol inhaler in 2 weeks and she thinks there is something wrong with his lungs.   Physical Exam: Pertinent physical exam findings include Incontinent of urine Normal neuro Scattered wheezes and decreased air movement  Lab Tests: I ordered, and personally interpreted labs.  The pertinent results include: No pertinent findings   Imaging Studies: I ordered and independently visualized and interpreted chest x-ray, head CT and C-spine CT and I agree with the radiologist that there are no acute findings   Cardiac Monitoring:  The patient was maintained on a cardiac monitor.  I viewed and interpreted the cardiac monitored which showed an underlying rhythm of: Sinus   Medications: DuoNeb.  Denies any pain   Consultations Obtained: I spoke with [place] with neurology and he recommends routine EEG.  MDM/Disposition: This is a 66 year old male who presented today  due to to a first-time seizure.  Lab work within normal limits.  No abnormalities on x-ray or CT imaging.  Work-up largely unremarkable outside of patient's hypoxia.  He presented satting at 88% on room air with EMS who put him on 3 L which brought his oxygen up to 93%.  I periodically checked on the patient and took him off of oxygen and each time he had a desaturation between 86-89 on room air.  I discussed this case with my attending physician Dr.  Wallace Cullens who cosigned this note including patient's presenting symptoms, physical exam, and planned diagnostics and interventions. Attending physician stated agreement with plan or made changes to plan which were implemented.      Final Clinical Impression(s) / ED Diagnoses Final diagnoses:  Seizure (HCC)  Hypoxia    Rx / DC Orders ED Discharge Orders     None      Admit to Dr. Anson Fret, Kerem Gilmer A, PA-C 11/23/21 2317    Sloan Leiter, DO 11/23/21 2357

## 2021-11-23 NOTE — Assessment & Plan Note (Signed)
Presented with symptoms of progressive shortness of breath, cough and hypoxia with oxygen of 89% requiring 2 L. -He does not have a formal diagnosis of COPD but at least a 30-pack-year smoking history -Will place on daily IV solu medrol -PRN duoneb q6hr  -goal O2 of >92%

## 2021-11-23 NOTE — Assessment & Plan Note (Signed)
Reports 1 pack per day smoking hx for at least 30 years

## 2021-11-24 ENCOUNTER — Observation Stay (HOSPITAL_COMMUNITY): Payer: Medicare Other

## 2021-11-24 DIAGNOSIS — J9601 Acute respiratory failure with hypoxia: Secondary | ICD-10-CM | POA: Diagnosis not present

## 2021-11-24 DIAGNOSIS — R569 Unspecified convulsions: Secondary | ICD-10-CM | POA: Diagnosis not present

## 2021-11-24 LAB — RAPID URINE DRUG SCREEN, HOSP PERFORMED
Amphetamines: NOT DETECTED
Barbiturates: NOT DETECTED
Benzodiazepines: NOT DETECTED
Cocaine: NOT DETECTED
Opiates: NOT DETECTED
Tetrahydrocannabinol: NOT DETECTED

## 2021-11-24 LAB — RESP PANEL BY RT-PCR (FLU A&B, COVID) ARPGX2
Influenza A by PCR: NEGATIVE
Influenza B by PCR: NEGATIVE
SARS Coronavirus 2 by RT PCR: NEGATIVE

## 2021-11-24 LAB — BASIC METABOLIC PANEL
Anion gap: 11 (ref 5–15)
BUN: 11 mg/dL (ref 8–23)
CO2: 22 mmol/L (ref 22–32)
Calcium: 9.4 mg/dL (ref 8.9–10.3)
Chloride: 104 mmol/L (ref 98–111)
Creatinine, Ser: 0.91 mg/dL (ref 0.61–1.24)
GFR, Estimated: >60 mL/min (ref >60)
Glucose, Bld: 131 mg/dL — ABNORMAL HIGH (ref 70–99)
Potassium: 4 mmol/L (ref 3.5–5.1)
Sodium: 137 mmol/L (ref 135–145)

## 2021-11-24 LAB — HEPATITIS C ANTIBODY: HCV Ab: REACTIVE — AB

## 2021-11-24 LAB — HEMOGLOBIN A1C
Hgb A1c MFr Bld: 4.8 % (ref 4.8–5.6)
Mean Plasma Glucose: 91.06 mg/dL

## 2021-11-24 MED ORDER — LEVETIRACETAM 500 MG PO TABS: 500.0000 mg | ORAL_TABLET | Freq: Two times a day (BID) | ORAL | 1 refills | Status: AC

## 2021-11-24 MED ORDER — LEVETIRACETAM IN NACL 500 MG/100ML IV SOLN
500.0000 mg | Freq: Two times a day (BID) | INTRAVENOUS | Status: DC
  Administered 2021-11-24: 500 mg via INTRAVENOUS
  Filled 2021-11-24: qty 100

## 2021-11-24 MED ORDER — METOPROLOL TARTRATE 25 MG PO TABS: 25.0000 mg | ORAL_TABLET | Freq: Two times a day (BID) | ORAL | Status: DC

## 2021-11-24 MED ORDER — ALBUTEROL SULFATE (2.5 MG/3ML) 0.083% IN NEBU
2.5000 mg | INHALATION_SOLUTION | Freq: Four times a day (QID) | RESPIRATORY_TRACT | Status: DC | PRN
  Administered 2021-11-24: 2.5 mg via RESPIRATORY_TRACT
  Filled 2021-11-24: qty 3

## 2021-11-24 MED ORDER — LEVETIRACETAM 500 MG PO TABS
500.0000 mg | ORAL_TABLET | Freq: Once | ORAL | Status: AC
  Administered 2021-11-24: 500 mg via ORAL
  Filled 2021-11-24: qty 1

## 2021-11-24 NOTE — ED Notes (Signed)
Discharge instructions reviewed with patient. Patient given Kepra dose for tonight, reviewed need for follow-up care with patient. Patient verbalized understanding and denies needs at this time. Patient ambulatory out to lobby to wait for ride.

## 2021-11-24 NOTE — Progress Notes (Signed)
EEG complete - results pending 

## 2021-11-24 NOTE — ED Notes (Signed)
Pt transported to MRI 

## 2021-11-24 NOTE — Discharge Summary (Signed)
Billy Cole GGE:[id number] DOB: 02/04/1955 DOA: 11/23/2021  PCP: System, Provider Not In  Admit date: 11/23/2021 Discharge date: Dec 15, 2021  Time spent: 35 minutes  Recommendations for Outpatient Follow-up:  Neurology and pcp f/u     Discharge Diagnoses:  Principal Problem:   Acute respiratory failure with hypoxia (HCC) Active Problems:   Seizure (HCC)   Tobacco use   AAA (abdominal aortic aneurysm) (HCC)   History of aortic valve replacement   Schizophrenia (HCC)   Tobacco dependence due to cigarettes   Discharge Condition: stable  Diet recommendation: heart healthy  There were no vitals filed for this visit.  History of present illness:  From admission h and p Billy Cole is a 66 y.o. male with medical history significant of COPD, ongoing tobacco use, HLD, AAA, splenic artery aneursym, schizophrenia, HLD who presents with seizure like activity.    Patient is a limited historian.  He only remembers waking up to EMS surrounding him and not able to recall any events about today.  Unable to locate family for collateral.  Per documentation, girlfriend says he was watching TV and had a full body seizure with incontinence.    He also report progressive increasing shortness of breath for at least the past year when climbing stairs up to his trailer.  Has nonproductive cough.  He smokes about a pack a day for "many years."  Denies any alcohol or illicit drug use.  Hospital Course:  Patient presents after witnessed generalized tonic clonic seizure at home. No injury. MRI and EEG negative. Neurology consulted, on further questioning appears patient has been having partial seizures. Patient was started on keppra here and neurology advises continuing that as outpatient. Seizure precautions reviewed. Referral to neurology placed. Patient has returned to his baseline and girlfriend will transport home.  Procedures: EEG   Consultations: neurology  Discharge Exam: Vitals:   12-15-21  0900 2021-12-15 1221  BP: 119/72 126/78  Pulse: 94 96  Resp: 20 20  Temp:  98 F (36.7 C)  SpO2: 92% 93%    General: NAD3 Cardiovascular: RRR Respiratory: CTAB  Discharge Instructions   Discharge Instructions     Ambulatory referral to Neurology   Complete by: As directed    New seizure disorder   Diet - low sodium heart healthy   Complete by: As directed    Increase activity slowly   Complete by: As directed       Allergies as of 2021/12/15   No Known Allergies      Medication List     TAKE these medications    albuterol 108 (90 Base) MCG/ACT inhaler Commonly known as: VENTOLIN HFA Inhale 2 puffs into the lungs every 6 (six) hours as needed for wheezing or shortness of breath.   Alka-Seltzer Extra Strength 500 MG Tbef Generic drug: Aspirin Effervescent Take by mouth.   levETIRAcetam 500 MG tablet Commonly known as: Keppra Take 1 tablet (500 mg total) by mouth 2 (two) times daily.   LOPRESSOR PO Take 25 mg by mouth 2 (two) times daily.   perphenazine 8 MG tablet Commonly known as: TRILAFON Resume perphenazine 8mg  p.o. at bedtime and 2mg  q a.m.       No Known Allergies    The results of significant diagnostics from this hospitalization (including imaging, microbiology, ancillary and laboratory) are listed below for reference.    Significant Diagnostic Studies: EEG adult  Result Date: 12/15/21 , MD     15-Dec-2021  8:51 AM Patient Name: Billy Cole  MRN: [id number] Epilepsy Attending: Charlsie Cole Referring Physician/Provider: Saddie Benders, PA-C Date: 11/24/2021 Duration: 21.46 mins Patient history: 66 year old male with generalized tonic-clonic seizure-like activity.  EEG evaluate for seizure. Level of alertness: Awake AEDs during EEG study: None Technical aspects: This EEG study was done with scalp electrodes positioned according to the 10-20 International system of electrode placement. Electrical activity was reviewed with  band pass filter of 1-70Hz , sensitivity of 7 uV/mm, display speed of 18mm/sec with a 60Hz  notched filter applied as appropriate. EEG data were recorded continuously and digitally stored.  Video monitoring was available and reviewed as appropriate. Description: The posterior dominant rhythm consists of 9 Hz activity of moderate voltage (25-35 uV) seen predominantly in posterior head regions, symmetric and reactive to eye opening and eye closing. Hyperventilation and photic stimulation were not performed.   IMPRESSION: This study is within normal limits. No seizures or epileptiform discharges were seen throughout the recording. A normal interictal EEG does not exclude the diagnosis of epilepsy.   MR BRAIN WO CONTRAST  Result Date: 11/24/2021 CLINICAL DATA:  66 year old male with prolonged seizure.  Postictal. EXAM: MRI HEAD WITHOUT CONTRAST TECHNIQUE: Multiplanar, multiecho pulse sequences of the brain and surrounding structures were obtained without intravenous contrast. COMPARISON:  Head and cervical spine CT 11/23/2021. FINDINGS: Brain: Cerebral volume is within normal limits for age. No restricted diffusion to suggest acute infarction. No midline shift, mass effect, evidence of mass lesion, ventriculomegaly, extra-axial collection or acute intracranial hemorrhage. Cervicomedullary junction and pituitary are within normal limits. Widely scattered small foci of T2/FLAIR hyperintensity in the bilateral cerebral white matter, especially the frontal and parietal lobes). The pattern is nonspecific. The extent is moderate. No cortical encephalomalacia identified. Chronic microhemorrhage in the right superior frontal gyrus on series 14, image 40. but no other convincing chronic cerebral blood products. Deep gray nuclei, brainstem and cerebellum appear negative. On thin slice coronal imaging hippocampal formations and other mesial temporal lobe structures appear within normal limits. Vascular: Major  intracranial vascular flow voids are preserved. Skull and upper cervical spine: Partially visible cervical spine degeneration which probably accounts for the appearance of T1 marrow signal heterogeneity in the upper cervical vertebrae. No destructive osseous lesion identified. Skull marrow signal appears normal. Sinuses/Orbits: Negative. Other: Trace mastoid air cell fluid, likely postinflammatory. Tiny central and right side nasopharyngeal retention cysts (benign series 10, image 4). Grossly normal visible internal auditory structures. Negative visible scalp and face. IMPRESSION: 1. No acute intracranial abnormality. 2. Evidence of AA solitary chronic microhemorrhage in the right superior frontal gyrus, and moderate for age nonspecific bilateral cerebral white matter signal changes. Electronically Signed   By: 11/25/2021 M.D.   On: 11/24/2021 04:40   DG Chest 2 View  Result Date: 11/23/2021 CLINICAL DATA:  Seizure, shortness of breath EXAM: CHEST - 2 VIEW COMPARISON:  12/08/2010 FINDINGS: Prior median sternotomy and valve replacement. Heart is normal size. No confluent airspace opacities or effusions. No acute bony abnormality. IMPRESSION: No active cardiopulmonary disease. Electronically Signed   By: 14/05/2010 M.D.   On: 11/23/2021 21:08   CT Head Wo Contrast  Result Date: 11/23/2021 CLINICAL DATA:  Head trauma, moderate-severe; Neck trauma, dangerous injury mechanism (Age 67-64y) EXAM: CT HEAD WITHOUT CONTRAST CT CERVICAL SPINE WITHOUT CONTRAST TECHNIQUE: Multidetector CT imaging of the head and cervical spine was performed following the standard protocol without intravenous contrast. Multiplanar CT image reconstructions of the cervical spine were also generated. RADIATION DOSE REDUCTION: This exam was performed  according to the departmental dose-optimization program which includes automated exposure control, adjustment of the mA and/or kV according to patient size and/or use of iterative reconstruction  technique. COMPARISON:  CT angio abdomen 12/16/2015, CT chest 09/20/2021 FINDINGS: CT HEAD FINDINGS Brain: No evidence of large-territorial acute infarction. No parenchymal hemorrhage. No mass lesion. No extra-axial collection. No mass effect or midline shift. No hydrocephalus. Basilar cisterns are patent. Vascular: No hyperdense vessel. Atherosclerotic calcifications are present within the cavernous internal carotid arteries. The follow-up Skull: No acute fracture or focal lesion. Sinuses/Orbits: Paranasal sinuses and mastoid air cells are clear. The orbits are unremarkable. Other: None. CT CERVICAL SPINE FINDINGS Alignment: Normal. Skull base and vertebrae: C3 through C6 multilevel moderate to severe degenerative changes spine. Posterior disc osteophyte complex formation at the C5-C6 level. Moderate severe osseous neural foraminal stenosis at the left C5-C6 level. No acute fracture. No aggressive appearing focal osseous lesion or focal pathologic process. Soft tissues and spinal canal: No prevertebral fluid or swelling. No visible canal hematoma. Upper chest: Severe emphysematous changes. Other: Atherosclerotic plaque. Patulous esophagus with debris within the lumen. IMPRESSION: 1. No acute intracranial abnormality. 2. No acute displaced fracture or traumatic listhesis of the cervical spine. 3.  Emphysema (ICD10-J43.9). 4. Nonspecific patulous partially visualized esophagus with debris within the lumen. Electronically Signed   By: Tish FredericksonMorgane  Naveau M.D.   On: 11/23/2021 21:07   CT Cervical Spine Wo Contrast  Result Date: 11/23/2021 CLINICAL DATA:  Head trauma, moderate-severe; Neck trauma, dangerous injury mechanism (Age 67-64y) EXAM: CT HEAD WITHOUT CONTRAST CT CERVICAL SPINE WITHOUT CONTRAST TECHNIQUE: Multidetector CT imaging of the head and cervical spine was performed following the standard protocol without intravenous contrast. Multiplanar CT image reconstructions of the cervical spine were also  generated. RADIATION DOSE REDUCTION: This exam was performed according to the departmental dose-optimization program which includes automated exposure control, adjustment of the mA and/or kV according to patient size and/or use of iterative reconstruction technique. COMPARISON:  CT angio abdomen 12/16/2015, CT chest 09/20/2021 FINDINGS: CT HEAD FINDINGS Brain: No evidence of large-territorial acute infarction. No parenchymal hemorrhage. No mass lesion. No extra-axial collection. No mass effect or midline shift. No hydrocephalus. Basilar cisterns are patent. Vascular: No hyperdense vessel. Atherosclerotic calcifications are present within the cavernous internal carotid arteries. The follow-up Skull: No acute fracture or focal lesion. Sinuses/Orbits: Paranasal sinuses and mastoid air cells are clear. The orbits are unremarkable. Other: None. CT CERVICAL SPINE FINDINGS Alignment: Normal. Skull base and vertebrae: C3 through C6 multilevel moderate to severe degenerative changes spine. Posterior disc osteophyte complex formation at the C5-C6 level. Moderate severe osseous neural foraminal stenosis at the left C5-C6 level. No acute fracture. No aggressive appearing focal osseous lesion or focal pathologic process. Soft tissues and spinal canal: No prevertebral fluid or swelling. No visible canal hematoma. Upper chest: Severe emphysematous changes. Other: Atherosclerotic plaque. Patulous esophagus with debris within the lumen. IMPRESSION: 1. No acute intracranial abnormality. 2. No acute displaced fracture or traumatic listhesis of the cervical spine. 3.  Emphysema (ICD10-J43.9). 4. Nonspecific patulous partially visualized esophagus with debris within the lumen. Electronically Signed   By: Tish FredericksonMorgane  Naveau M.D.   On: 11/23/2021 21:07    Microbiology: Recent Results (from the past 240 hour(s))  Resp Panel by RT-PCR (Flu A&B, Covid) Anterior Nasal Swab     Status: None   Collection Time: 11/23/21 10:42 PM   Specimen:  Anterior Nasal Swab  Result Value Ref Range Status   SARS Coronavirus 2 by RT PCR NEGATIVE  NEGATIVE Final    Comment: (NOTE) SARS-CoV-2 target nucleic acids are NOT DETECTED.  The SARS-CoV-2 RNA is generally detectable in upper respiratory specimens during the acute phase of infection. The lowest concentration of SARS-CoV-2 viral copies this assay can detect is 138 copies/mL. A negative result does not preclude SARS-Cov-2 infection and should not be used as the sole basis for treatment or other patient management decisions. A negative result may occur with  improper specimen collection/handling, submission of specimen other than nasopharyngeal swab, presence of viral mutation(s) within the areas targeted by this assay, and inadequate number of viral copies(<138 copies/mL). A negative result must be combined with clinical observations, patient history, and epidemiological information. The expected result is Negative.  Fact Sheet for Patients:  BloggerCourse.com  Fact Sheet for Healthcare Providers:  SeriousBroker.it  This test is no t yet approved or cleared by the [place] FDA and  has been authorized for detection and/or diagnosis of SARS-CoV-2 by FDA under an Emergency Use Authorization (EUA). This EUA will remain  in effect (meaning this test can be used) for the duration of the COVID-19 declaration under Section 564(b)(1) of the Act, 21 U.S.C.section 360bbb-3(b)(1), unless the authorization is terminated  or revoked sooner.       Influenza A by PCR NEGATIVE NEGATIVE Final   Influenza B by PCR NEGATIVE NEGATIVE Final    Comment: (NOTE) The Xpert Xpress SARS-CoV-2/FLU/RSV plus assay is intended as an aid in the diagnosis of influenza from Nasopharyngeal swab specimens and should not be used as a sole basis for treatment. Nasal washings and aspirates are unacceptable for Xpert Xpress SARS-CoV-2/FLU/RSV testing.  Fact  Sheet for Patients: BloggerCourse.com  Fact Sheet for Healthcare Providers: SeriousBroker.it  This test is not yet approved or cleared by the [place] FDA and has been authorized for detection and/or diagnosis of SARS-CoV-2 by FDA under an Emergency Use Authorization (EUA). This EUA will remain in effect (meaning this test can be used) for the duration of the COVID-19 declaration under Section 564(b)(1) of the Act, 21 U.S.C. section 360bbb-3(b)(1), unless the authorization is terminated or revoked.  Performed at [facility], [address] [place], [place] [address]      Labs: Basic Metabolic Panel: Recent Labs  Lab 11/23/21 2008 11/24/21 0339  NA 138 137  K 4.2 4.0  CL 102 104  CO2 24 22  GLUCOSE 125* 131*  BUN 12 11  CREATININE 1.16 0.91  CALCIUM 9.4 9.4   Liver Function Tests: Recent Labs  Lab 11/23/21 2008  AST 41  ALT 54*  ALKPHOS 70  BILITOT 0.4  PROT 7.2  ALBUMIN 3.9   No results for input(s): "LIPASE", "AMYLASE" in the last 168 hours. No results for input(s): "AMMONIA" in the last 168 hours. CBC: Recent Labs  Lab 11/23/21 2008  WBC 8.8  NEUTROABS 7.3  HGB 16.9  HCT 47.8  MCV 89.0  PLT 175   Cardiac Enzymes: No results for input(s): "CKTOTAL", "CKMB", "CKMBINDEX", "TROPONINI" in the last 168 hours. BNP: BNP (last 3 results) No results for input(s): "BNP" in the last 8760 hours.  ProBNP (last 3 results) No results for input(s): "PROBNP" in the last 8760 hours.  CBG: Recent Labs  Lab 11/23/21 2017  GLUCAP 128*       Signed:  Silvano Bilis MD.  [facility] 11/24/2021, 12:39 PM

## 2021-11-24 NOTE — Consult Note (Signed)
NEURO HOSPITALIST CONSULT NOTE   Requestig physician: Dr. Ashok Pall   Reason for Consult:Seizure   History obtained from: Patient and Chart   HPI:                                                                                                                                          Billy Cole is an 66 y.o. male with AAA, emphysema, HTN, and schizophrenia presenting to [facility] ED for witnessed seizure that occurred 11/20 1 in the evening while patient was watching television with girlfriend.  Patient has no recollection of the event, states that he woke up in the ambulance once it was over.  Denies any prodrome.  Denies any previous history of seizures.  Also denies any illicit drug use.  Mentions that he smoked marijuana briefly in the past but stopped.  Denies any alcohol use. He smokes cigarettes.   Contacted patient's girlfriend, Ollen Barges, who corroborated the witnessed seizure.  Patient was in his recliner at home watching television when he started convulsing violently and foaming at the mouth.  Her girlfriend says she has never witnessed him having a seizure before.  However she does note that last year he had 3-4 episodes in which he started acting confused, slurring his words and not responding to questions.  Missy also says that the patient has commented to her that he has had "blackout" episodes in which he wakes up on the floor.  She confirms that these episodes have occurred without the use of any substances or alcohol.  He denies any headaches, changes in vision, photophobia, or dizziness.  Past Medical History:  Diagnosis Date   Aortic stenosis    Emphysema lung (HCC)    Hypertension    Left inguinal hernia    Psychiatric disorder     Past Surgical History:  Procedure Laterality Date   AORTIC VALVE REPLACEMENT     with perrcardial tissue valve [employer] model 3300TFX 7mm, serial number [id number] on  11-16-2010    No family history on file.     Social History:  reports that he has been smoking cigarettes. He has been smoking an average of 1 pack per day. He has never used smokeless tobacco. He reports that he does not drink alcohol and does not use drugs.  No Known Allergies  MEDICATIONS:  Scheduled:  enoxaparin (LOVENOX) injection  40 mg Subcutaneous Daily   methylPREDNISolone (SOLU-MEDROL) injection  40 mg Intravenous Daily   metoprolol tartrate  25 mg Oral BID     ROS:                                                                                                                                       Negative except as mentioned in the HPI.   Blood pressure 119/72, pulse 94, temperature 99 F (37.2 C), temperature source Oral, resp. rate 20, SpO2 92 %.   General Examination:                                                                                                      Physical Exam  General:  HEENT-  Normocephalic, no lesions, without obvious abnormality.  Normal external eye and conjunctiva.   Lungs-no rhonchi or wheezing noted, no excessive working breathing.  Saturations within normal limits Extremities- Warm, dry and intact Musculoskeletal-no joint tenderness, deformity or swelling Skin-warm and dry, no hyperpigmentation, vitiligo, or suspicious lesions  Neurological Examination Mental Status: Alert, oriented, thought content appropriate.  Speech fluent without evidence of aphasia.  Able to follow 3 step commands without difficulty. Cranial Nerves: II: Discs flat bilaterally; Visual fields grossly normal,  III,IV, VI: ptosis not present, extra-ocular motions intact bilaterally pupils equal, round, reactive to light and accommodation V,VII: smile symmetric, facial light touch sensation normal bilaterally VIII: hearing normal bilaterally IX,X: uvula rises symmetrically XI: bilateral  shoulder shrug XII: midline tongue extension Motor: UE Strength  Right  Left Deltoids  5/5  5/5 Triceps   5/5  5/5 Biceps   5/5  5/5 Wrist FL  5/5  5/5 Wrist Ext  5/5  5/5 Finger grip  5/5  5/5 LE Strength  Right  Left Hip Flex  5/5  5/5 Knee flexed  5/5  5/5 Knee X  5/5  5/5 Dorsiflexion  5/5  5/5 Plantarflexion  5/5  5/5  Tone and bulk:normal tone throughout; no atrophy noted Sensory: Pinprick and light touch intact throughout, bilaterally Deep Tendon Reflexes: 2+ and symmetric throughout Cerebellar: FNF: WNL Rapid alternating movements: WNL Heel-to-shin: WNL Gait: deferred    Lab Results: Basic Metabolic Panel: Recent Labs  Lab 11/23/21 2008 11/24/21 0339  NA 138 137  K 4.2 4.0  CL 102 104  CO2 24 22  GLUCOSE 125* 131*  BUN 12 11  CREATININE 1.16 0.91  CALCIUM 9.4 9.4    CBC: Recent  Labs  Lab 11/23/21 2008  WBC 8.8  NEUTROABS 7.3  HGB 16.9  HCT 47.8  MCV 89.0  PLT 175    Cardiac Enzymes: No results for input(s): "CKTOTAL", "CKMB", "CKMBINDEX", "TROPONINI" in the last 168 hours.  Lipid Panel: No results for input(s): "CHOL", "TRIG", "HDL", "CHOLHDL", "VLDL", "LDLCALC" in the last 168 hours.  Imaging: EEG adult  Result Date: 11/24/2021 Lora Havens, MD     11/24/2021  8:51 AM Patient Name: Billy Cole MRN: [id number] Epilepsy Attending: Lora Havens Referring Physician/Provider: Rhae Hammock, PA-C Date: 11/24/2021 Duration: 21.46 mins Patient history: 66 year old male with generalized tonic-clonic seizure-like activity.  EEG evaluate for seizure. Level of alertness: Awake AEDs during EEG study: None Technical aspects: This EEG study was done with scalp electrodes positioned according to the 10-20 International system of electrode placement. Electrical activity was reviewed with band pass filter of 1-70Hz , sensitivity of 7 uV/mm, display speed of 79mm/sec with a 60Hz  notched filter applied as appropriate. EEG data were recorded  continuously and digitally stored.  Video monitoring was available and reviewed as appropriate. Description: The posterior dominant rhythm consists of 9 Hz activity of moderate voltage (25-35 uV) seen predominantly in posterior head regions, symmetric and reactive to eye opening and eye closing. Hyperventilation and photic stimulation were not performed.   IMPRESSION: This study is within normal limits. No seizures or epileptiform discharges were seen throughout the recording. A normal interictal EEG does not exclude the diagnosis of epilepsy. Lora Havens   MR BRAIN WO CONTRAST  Result Date: 11/24/2021 CLINICAL DATA:  66 year old male with prolonged seizure.  Postictal. EXAM: MRI HEAD WITHOUT CONTRAST TECHNIQUE: Multiplanar, multiecho pulse sequences of the brain and surrounding structures were obtained without intravenous contrast. COMPARISON:  Head and cervical spine CT 11/23/2021. FINDINGS: Brain: Cerebral volume is within normal limits for age. No restricted diffusion to suggest acute infarction. No midline shift, mass effect, evidence of mass lesion, ventriculomegaly, extra-axial collection or acute intracranial hemorrhage. Cervicomedullary junction and pituitary are within normal limits. Widely scattered small foci of T2/FLAIR hyperintensity in the bilateral cerebral white matter, especially the frontal and parietal lobes). The pattern is nonspecific. The extent is moderate. No cortical encephalomalacia identified. Chronic microhemorrhage in the right superior frontal gyrus on series 14, image 40. but no other convincing chronic cerebral blood products. Deep gray nuclei, brainstem and cerebellum appear negative. On thin slice coronal imaging hippocampal formations and other mesial temporal lobe structures appear within normal limits. Vascular: Major intracranial vascular flow voids are preserved. Skull and upper cervical spine: Partially visible cervical spine degeneration which probably accounts for  the appearance of T1 marrow signal heterogeneity in the upper cervical vertebrae. No destructive osseous lesion identified. Skull marrow signal appears normal. Sinuses/Orbits: Negative. Other: Trace mastoid air cell fluid, likely postinflammatory. Tiny central and right side nasopharyngeal retention cysts (benign series 10, image 4). Grossly normal visible internal auditory structures. Negative visible scalp and face. IMPRESSION: 1. No acute intracranial abnormality. 2. Evidence of AA solitary chronic microhemorrhage in the right superior frontal gyrus, and moderate for age nonspecific bilateral cerebral white matter signal changes. Electronically Signed   By: Genevie Ann M.D.   On: 11/24/2021 04:40   DG Chest 2 View  Result Date: 11/23/2021 CLINICAL DATA:  Seizure, shortness of breath EXAM: CHEST - 2 VIEW COMPARISON:  12/08/2010 FINDINGS: Prior median sternotomy and valve replacement. Heart is normal size. No confluent airspace opacities or effusions. No acute bony abnormality. IMPRESSION: No active cardiopulmonary disease. Electronically  Signed   By: Rolm Baptise M.D.   On: 11/23/2021 21:08   CT Head Wo Contrast  Result Date: 11/23/2021 CLINICAL DATA:  Head trauma, moderate-severe; Neck trauma, dangerous injury mechanism (Age 58-64y) EXAM: CT HEAD WITHOUT CONTRAST CT CERVICAL SPINE WITHOUT CONTRAST TECHNIQUE: Multidetector CT imaging of the head and cervical spine was performed following the standard protocol without intravenous contrast. Multiplanar CT image reconstructions of the cervical spine were also generated. RADIATION DOSE REDUCTION: This exam was performed according to the departmental dose-optimization program which includes automated exposure control, adjustment of the mA and/or kV according to patient size and/or use of iterative reconstruction technique. COMPARISON:  CT angio abdomen 12/16/2015, CT chest 09/20/2021 FINDINGS: CT HEAD FINDINGS Brain: No evidence of large-territorial acute  infarction. No parenchymal hemorrhage. No mass lesion. No extra-axial collection. No mass effect or midline shift. No hydrocephalus. Basilar cisterns are patent. Vascular: No hyperdense vessel. Atherosclerotic calcifications are present within the cavernous internal carotid arteries. The follow-up Skull: No acute fracture or focal lesion. Sinuses/Orbits: Paranasal sinuses and mastoid air cells are clear. The orbits are unremarkable. Other: None. CT CERVICAL SPINE FINDINGS Alignment: Normal. Skull base and vertebrae: C3 through C6 multilevel moderate to severe degenerative changes spine. Posterior disc osteophyte complex formation at the C5-C6 level. Moderate severe osseous neural foraminal stenosis at the left C5-C6 level. No acute fracture. No aggressive appearing focal osseous lesion or focal pathologic process. Soft tissues and spinal canal: No prevertebral fluid or swelling. No visible canal hematoma. Upper chest: Severe emphysematous changes. Other: Atherosclerotic plaque. Patulous esophagus with debris within the lumen. IMPRESSION: 1. No acute intracranial abnormality. 2. No acute displaced fracture or traumatic listhesis of the cervical spine. 3.  Emphysema (ICD10-J43.9). 4. Nonspecific patulous partially visualized esophagus with debris within the lumen. Electronically Signed   By: Iven Finn M.D.   On: 11/23/2021 21:07   CT Cervical Spine Wo Contrast  Result Date: 11/23/2021 CLINICAL DATA:  Head trauma, moderate-severe; Neck trauma, dangerous injury mechanism (Age 3-64y) EXAM: CT HEAD WITHOUT CONTRAST CT CERVICAL SPINE WITHOUT CONTRAST TECHNIQUE: Multidetector CT imaging of the head and cervical spine was performed following the standard protocol without intravenous contrast. Multiplanar CT image reconstructions of the cervical spine were also generated. RADIATION DOSE REDUCTION: This exam was performed according to the departmental dose-optimization program which includes automated exposure  control, adjustment of the mA and/or kV according to patient size and/or use of iterative reconstruction technique. COMPARISON:  CT angio abdomen 12/16/2015, CT chest 09/20/2021 FINDINGS: CT HEAD FINDINGS Brain: No evidence of large-territorial acute infarction. No parenchymal hemorrhage. No mass lesion. No extra-axial collection. No mass effect or midline shift. No hydrocephalus. Basilar cisterns are patent. Vascular: No hyperdense vessel. Atherosclerotic calcifications are present within the cavernous internal carotid arteries. The follow-up Skull: No acute fracture or focal lesion. Sinuses/Orbits: Paranasal sinuses and mastoid air cells are clear. The orbits are unremarkable. Other: None. CT CERVICAL SPINE FINDINGS Alignment: Normal. Skull base and vertebrae: C3 through C6 multilevel moderate to severe degenerative changes spine. Posterior disc osteophyte complex formation at the C5-C6 level. Moderate severe osseous neural foraminal stenosis at the left C5-C6 level. No acute fracture. No aggressive appearing focal osseous lesion or focal pathologic process. Soft tissues and spinal canal: No prevertebral fluid or swelling. No visible canal hematoma. Upper chest: Severe emphysematous changes. Other: Atherosclerotic plaque. Patulous esophagus with debris within the lumen. IMPRESSION: 1. No acute intracranial abnormality. 2. No acute displaced fracture or traumatic listhesis of the cervical spine. 3.  Emphysema (  ICD10-J43.9). 4. Nonspecific patulous partially visualized esophagus with debris within the lumen. Electronically Signed   By: Iven Finn M.D.   On: 11/23/2021 21:07    Assessment and plan per attending neurologist   Assessment/ Billy Cole is a 66 year old male with PMHx of AAA, emphysema, HTN, and schizophrenia presenting to [facility] ED for witnessed seizure, now stable and asymptomatic with negative EEG findings.  Based on discussions with patient's partner, there is some suspicion that patient has  experienced seizure-like episodes in the past, described as blacking out. Imaging unremarkable for any acute intracranial abnormality.   Impression: There appears to be previous seizure history of unknown etiology, described as absence seizures by patient's girlfriend, with now a witnessed seizure.    Recommendations: Start Keppra 500 mg twice daily   SEIZURE PRECAUTIONS Per [facility] statutes, patients with seizures are not allowed to drive until they have been seizure-free for six months.   Use caution when using heavy equipment or power tools. Avoid working on ladders or at heights. Take showers instead of baths. Ensure the water temperature is not too high on the home water heater. Do not go swimming alone. Do not lock yourself in a room alone (i.e. bathroom). When caring for infants or small children, sit down when holding, feeding, or changing them to minimize risk of injury to the child in the event you have a seizure. Maintain good sleep hygiene. Avoid alcohol.    If patient has another seizure, call 911 and bring them back to the ED if: A.  The seizure lasts longer than 5 minutes.      B.  The patient doesn't wake shortly after the seizure or has new problems such as difficulty seeing, speaking or moving following the seizure C.  The patient was injured during the seizure D.  The patient has a temperature over 102 F (39C) E.  The patient vomited during the seizure and now is having trouble breathing     Christene Slates, MD PGY-1 11/24/2021, 11:25 AM     I have seen the patient reviewed the above note.  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  With his now witnessed generalized convulsion, I do think that starting antiepileptic therapy at this time would be prudent.  Recommend Keppra 500 mg twice daily.  I discussed with the patient that he is not allowed to drive, and he endorsed  understanding.  He will need to follow-up with outpatient neurology.  Roland Rack, MD [facility] [phone]  If 7pm- 7am, please page neurology on call as listed in [place].

## 2021-11-24 NOTE — ED Notes (Signed)
Pt returned from MRI °

## 2021-11-24 NOTE — Procedures (Signed)
Patient Name: Billy Cole  MRN: [id number]  Epilepsy Attending: Charlsie Quest  Referring Physician/Provider: Saddie Benders, PA-C  Date: 11/24/2021 Duration: 21.46 mins  Patient history: 66 year old male with generalized tonic-clonic seizure-like activity.  EEG evaluate for seizure.  Level of alertness: Awake  AEDs during EEG study: None  Technical aspects: This EEG study was done with scalp electrodes positioned according to the 10-20 International system of electrode placement. Electrical activity was reviewed with band pass filter of 1-70Hz , sensitivity of 7 uV/mm, display speed of 64mm/sec with a 60Hz  notched filter applied as appropriate. EEG data were recorded continuously and digitally stored.  Video monitoring was available and reviewed as appropriate.  Description: The posterior dominant rhythm consists of 9 Hz activity of moderate voltage (25-35 uV) seen predominantly in posterior head regions, symmetric and reactive to eye opening and eye closing. Hyperventilation and photic stimulation were not performed.     IMPRESSION: This study is within normal limits. No seizures or epileptiform discharges were seen throughout the recording.  A normal interictal EEG does not exclude the diagnosis of epilepsy.  [name] 
